# Patient Record
Sex: Female | Born: 1990 | Race: Black or African American | Hispanic: No | Marital: Single
Health system: Southern US, Community
[De-identification: ages and names within clinical notes are randomized; demographics above are authoritative.]

---
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
## 2010-11-20 LAB — RPR: RPR Ser Ql: NONREACTIVE

## 2010-11-20 LAB — CBC
MCHC: 33 g/dL (ref 30.0–36.0)
Platelets: 167 10*3/uL (ref 150–400)
RDW: 14.6 % (ref 11.5–15.5)

## 2010-12-25 IMAGING — US US OB DETAIL+14 WK
1 series · 14 of 28 positions shown · non-contrast
Comparison: none

OBSTETRICAL ULTRASOUND:
 This ultrasound exam was performed in the [HOSPITAL] Ultrasound Department.  The OB US report was generated in the AS system, and faxed to the ordering physician.  This report is also available in [REDACTED] PACS.

[Series 1: us ob detail +14 wk · 70 acquisitions, 14 frames shown]
[im 3/70]
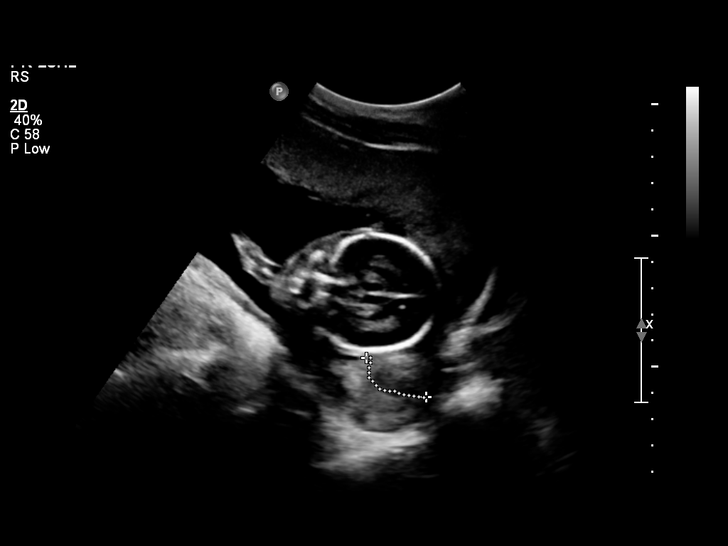
[im 8/70]
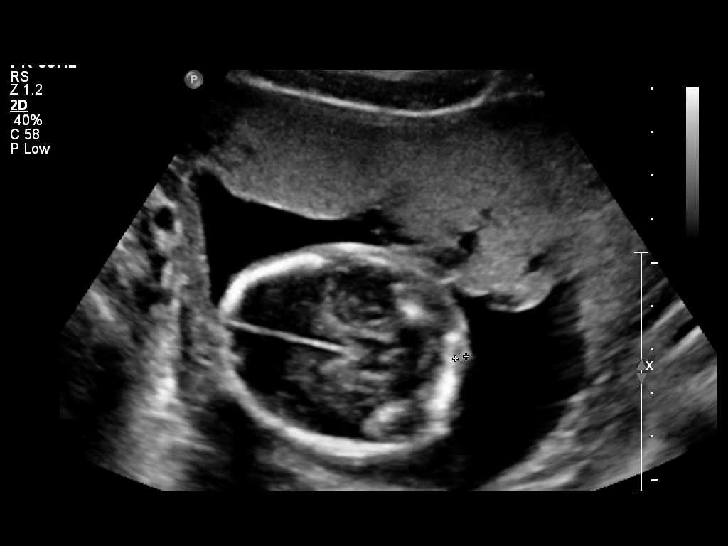
[im 13/70]
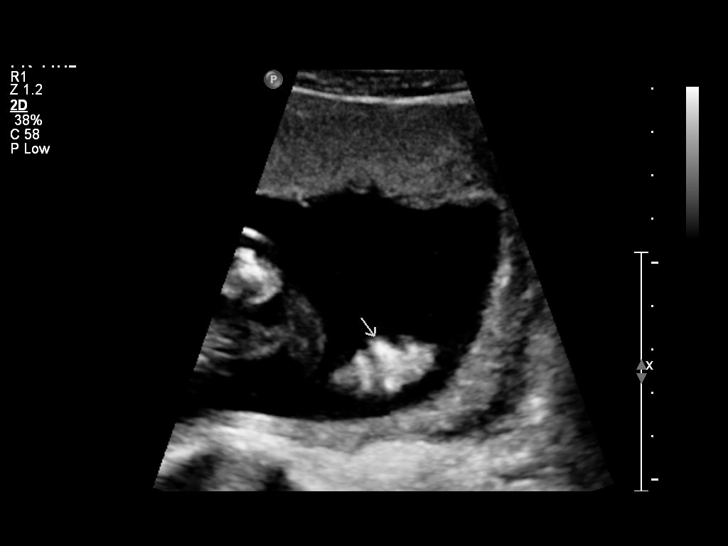
[im 18/70]
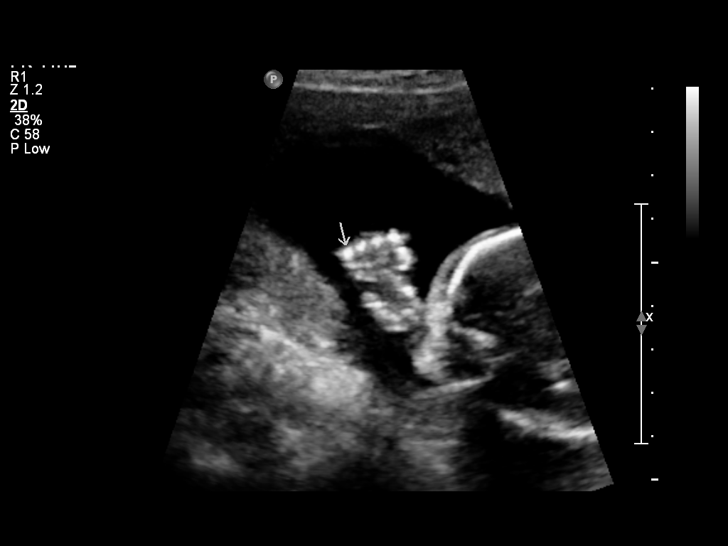
[im 24/70]
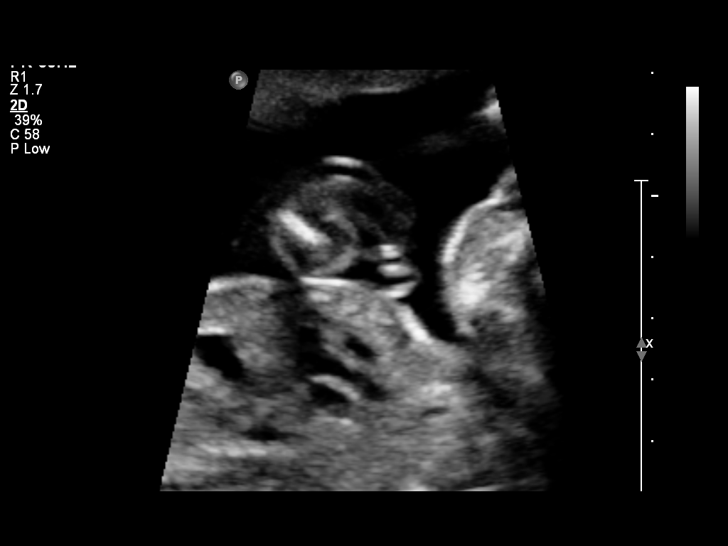
[im 29/70]
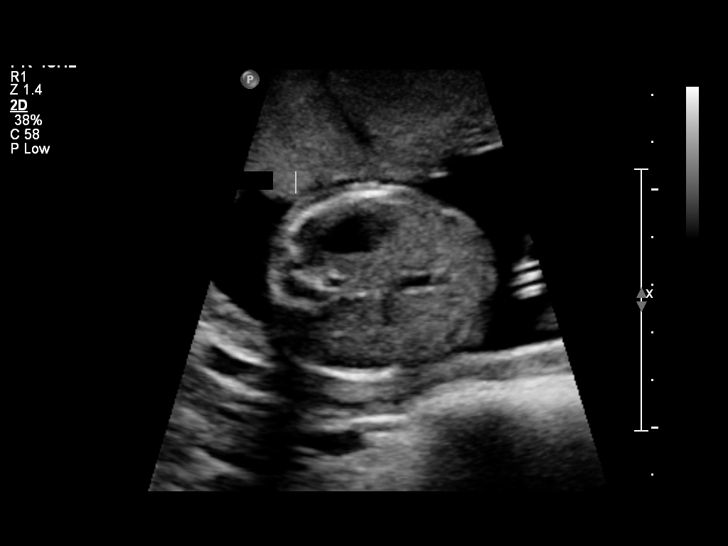
[im 34/70]
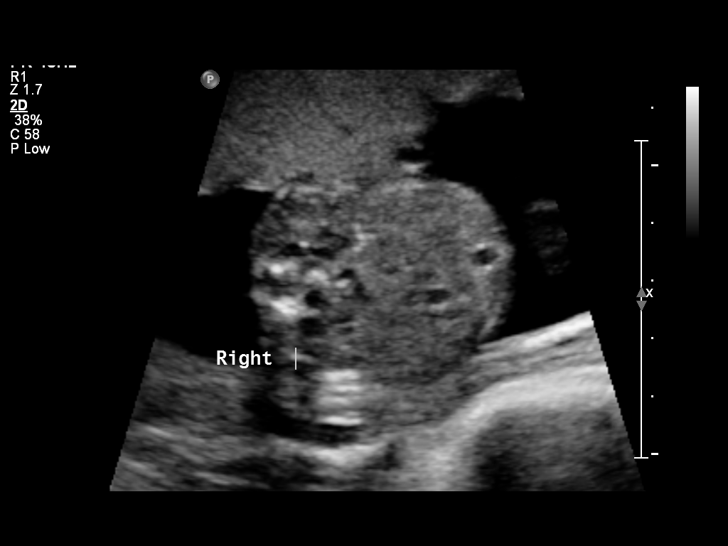
[im 39/70]
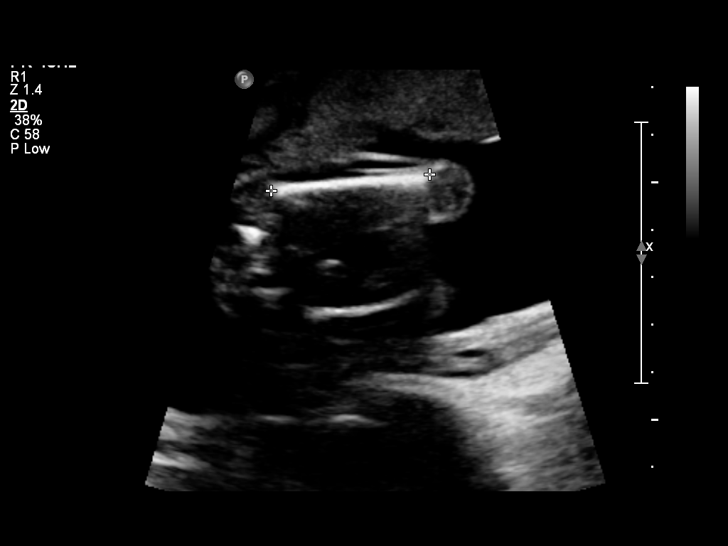
[im 44/70]
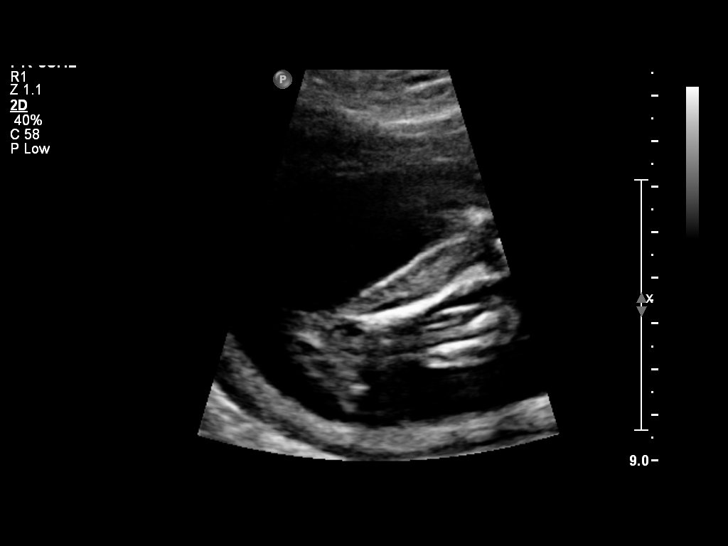
[im 49/70]
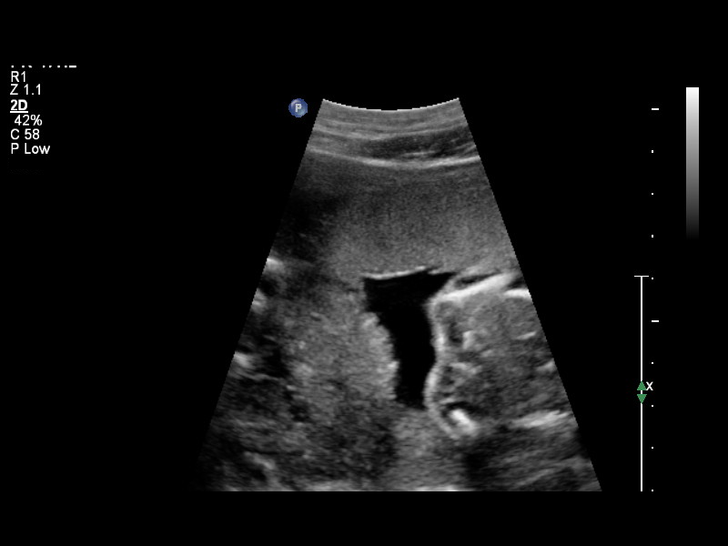
[im 54/70]
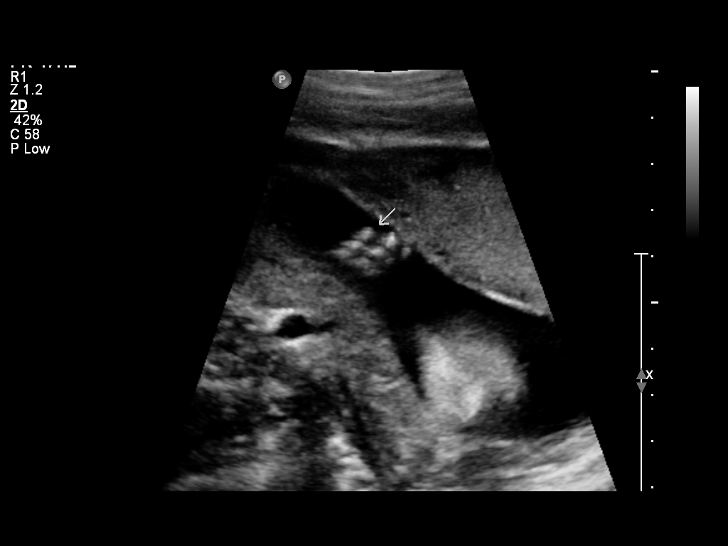
[im 59/70]
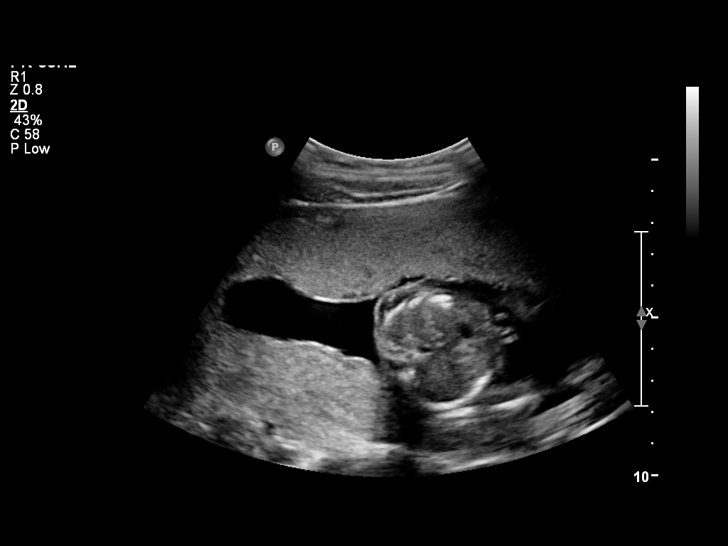
[im 64/70]
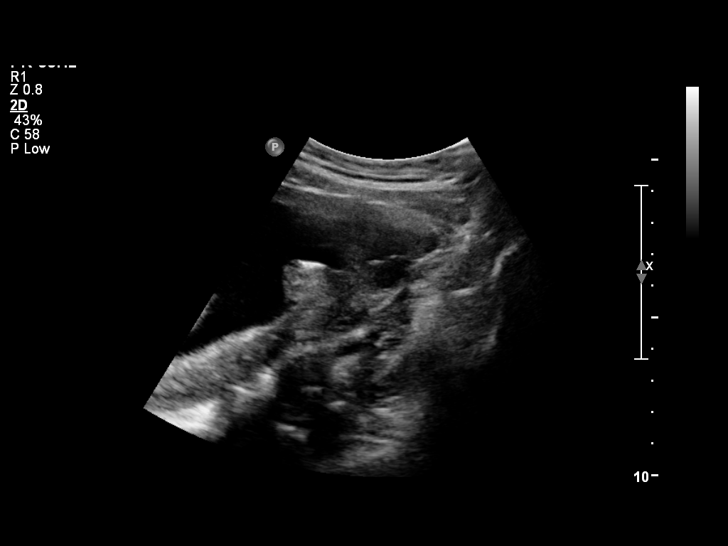
[im 70/70]
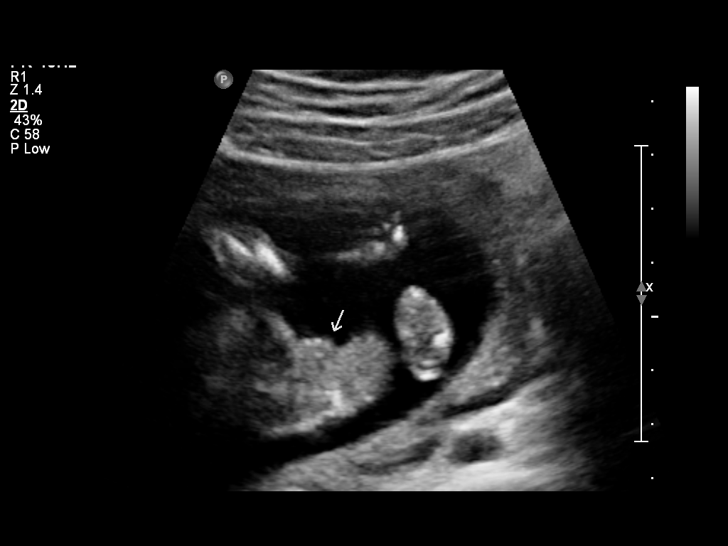

[14 of 28 positions shown; findings below may reference images not displayed]

IMPRESSION: See [facility] Obstetric US report.

## 2016-02-18 ENCOUNTER — Ambulatory Visit (HOSPITAL_COMMUNITY)
Admission: EM | Admit: 2016-02-18 | Discharge: 2016-02-18 | Disposition: A | Discharge status: Home / Self Care | Payer: 59 | Attending: Family Medicine | Admitting: Family Medicine

## 2016-02-18 ENCOUNTER — Encounter (HOSPITAL_COMMUNITY): Payer: Self-pay | Admitting: *Deleted

## 2016-02-18 DIAGNOSIS — N760 Acute vaginitis: Secondary | ICD-10-CM | POA: Diagnosis not present

## 2016-02-18 DIAGNOSIS — N898 Other specified noninflammatory disorders of vagina: Secondary | ICD-10-CM | POA: Diagnosis present

## 2016-02-18 LAB — POCT URINALYSIS DIP (DEVICE)
GLUCOSE, UA: NEGATIVE mg/dL
Hgb urine dipstick: NEGATIVE
Ketones, ur: NEGATIVE mg/dL
LEUKOCYTES UA: NEGATIVE
NITRITE: NEGATIVE
PROTEIN: 30 mg/dL — AB
SPECIFIC GRAVITY, URINE: 1.02 (ref 1.005–1.030)
UROBILINOGEN UA: 2 mg/dL — AB (ref 0.0–1.0)
pH: 7 (ref 5.0–8.0)

## 2016-02-18 LAB — POCT PREGNANCY, URINE: Preg Test, Ur: NEGATIVE

## 2016-02-18 MED ORDER — METRONIDAZOLE 0.75 % VA GEL: 1.0000 | Freq: Every day | VAGINAL | Status: AC

## 2016-02-18 NOTE — ED Provider Notes (Signed)
CSN: [id number]     Arrival date & time 02/18/16  1518 History   First MD Initiated Contact with Patient 02/18/16 1534     Chief Complaint  Patient presents with  . Vaginal Discharge   (Consider location/radiation/quality/duration/timing/severity/associated sxs/prior Treatment) Patient is a 25 y.o. female presenting with vaginal discharge. The history is provided by the patient.  Vaginal Discharge Quality:  Malodorous, white and watery Onset quality:  Gradual Duration:  2 days Progression:  Unchanged Chronicity:  New Context: spontaneously   Relieved by:  None tried Worsened by:  Nothing tried Ineffective treatments:  None tried Associated symptoms: no abdominal pain, no dysuria, no fever, no genital lesions, no vaginal itching and no vomiting   Risk factors: no new sexual partner     History reviewed. No pertinent past medical history. History reviewed. No pertinent past surgical history. History reviewed. No pertinent family history. Social History  Substance Use Topics  . Smoking status: None  . Smokeless tobacco: None  . Alcohol Use: No   OB History    No data available     Review of Systems  Constitutional: Negative.  Negative for fever.  Gastrointestinal: Negative.  Negative for vomiting and abdominal pain.  Genitourinary: Positive for vaginal discharge. Negative for dysuria, vaginal pain, menstrual problem and pelvic pain.  Musculoskeletal: Negative.   All other systems reviewed and are negative.   Allergies  Review of patient's allergies indicates not on file.  Home Medications   Prior to Admission medications   Medication Sig Start Date End Date Taking? Authorizing Provider  metroNIDAZOLE (METROGEL VAGINAL) 0.75 % vaginal gel Place 1 Applicatorful vaginally at bedtime. For 5 nights 02/18/16   Linna HoffJames D Kindl, MD   Meds Ordered and Administered this Visit  Medications - No data to display  BP 119/80 mmHg  Pulse 87  Temp(Src) 99.5 F (37.5 C) (Oral)  Resp  16  SpO2 100%  LMP 01/15/2016 (LMP Unknown) No data found.   Physical Exam  Constitutional: She is oriented to person, place, and time. She appears well-nourished. No distress.  Abdominal: Soft. Bowel sounds are normal. She exhibits no distension and no mass. There is no tenderness. There is no rebound and no guarding. Hernia confirmed negative in the right inguinal area and confirmed negative in the left inguinal area.  Genitourinary: Vagina normal and uterus normal. Pelvic exam was performed with patient supine. Uterus is not tender. Cervix exhibits no motion tenderness, no discharge and no friability. Left adnexum displays no tenderness. No tenderness in the vagina. No vaginal discharge found.  Lymphadenopathy:       Right: No inguinal adenopathy present.       Left: No inguinal adenopathy present.  Neurological: She is alert and oriented to person, place, and time.  Skin: Skin is warm and dry.  Nursing note and vitals reviewed.   ED Course  Procedures (including critical care time)  Labs Review Labs Reviewed  POCT URINALYSIS DIP (DEVICE) - Abnormal; Notable for the following:    Bilirubin Urine SMALL (*)    Protein, ur 30 (*)    Urobilinogen, UA 2.0 (*)    All other components within normal limits  POCT PREGNANCY, URINE  CERVICOVAGINAL ANCILLARY ONLY    Imaging Review No results found.   Visual Acuity Review  Right Eye Distance:   Left Eye Distance:   Bilateral Distance:    Right Eye Near:   Left Eye Near:    Bilateral Near:  MDM   1. Vaginitis        Linna HoffJames D Kindl, MD 02/18/16 1600

## 2016-02-18 NOTE — ED Notes (Addendum)
Pt  Reports   Symptoms  of  Vaginal  Discharge      With    Odor   With  Symptoms  X  Several days      Pt also  Reports  Some  Low abdominal  Pain as  Well      Pt ambulating  With  Slow  Steady  Gait     slightly  Late on  Her period  As  Well

## 2016-02-19 LAB — CERVICOVAGINAL ANCILLARY ONLY
Chlamydia: NEGATIVE
NEISSERIA GONORRHEA: NEGATIVE

## 2016-02-20 LAB — CERVICOVAGINAL ANCILLARY ONLY: Wet Prep (BD Affirm): POSITIVE — AB

## 2016-02-25 ENCOUNTER — Telehealth (HOSPITAL_COMMUNITY): Payer: Self-pay | Admitting: Emergency Medicine

## 2016-02-25 NOTE — Telephone Encounter (Signed)
LM on pt's VM [phone] Need to give lab results from recent visit on 7/4 Also let pt know labs can be obtained from MyChart  Per Dr. Dayton ScrapeMurray,   Notes Recorded by Eustace MooreLaura W Murray, MD on 02/23/2016 at 8:15 AM Please let patient know that tests for gonorrhea/chlamydia were negative. Test for gardnerella (bacterial vaginosis) was positive; [facility] Recheck as needed. LM

## 2017-01-12 ENCOUNTER — Other Ambulatory Visit (HOSPITAL_COMMUNITY)
Admission: RE | Admit: 2017-01-12 | Discharge: 2017-01-12 | Disposition: A | Discharge status: Home / Self Care | Payer: 59 | Source: Ambulatory Visit | Attending: Family Medicine | Admitting: Family Medicine

## 2017-01-12 ENCOUNTER — Other Ambulatory Visit: Payer: Self-pay | Admitting: Family Medicine

## 2017-01-12 DIAGNOSIS — Z01411 Encounter for gynecological examination (general) (routine) with abnormal findings: Secondary | ICD-10-CM | POA: Diagnosis not present

## 2017-01-15 LAB — CYTOLOGY - PAP: Diagnosis: NEGATIVE

## 2019-12-25 ENCOUNTER — Other Ambulatory Visit: Payer: Self-pay | Admitting: Family Medicine

## 2019-12-25 ENCOUNTER — Other Ambulatory Visit (HOSPITAL_COMMUNITY)
Admission: RE | Admit: 2019-12-25 | Discharge: 2019-12-25 | Disposition: A | Discharge status: Home / Self Care | Payer: No Typology Code available for payment source | Source: Ambulatory Visit | Attending: Family Medicine | Admitting: Family Medicine

## 2019-12-25 DIAGNOSIS — Z01411 Encounter for gynecological examination (general) (routine) with abnormal findings: Secondary | ICD-10-CM | POA: Insufficient documentation

## 2019-12-28 LAB — CYTOLOGY - PAP
Diagnosis: NEGATIVE
Diagnosis: REACTIVE
